# Patient Record
Sex: Male | Born: 1940 | Race: White | Hispanic: No | Marital: Married | Smoking: Never smoker
Health system: Southern US, Community
[De-identification: ages and names within clinical notes are randomized; demographics above are authoritative.]

## PROBLEM LIST (undated history)

## (undated) DIAGNOSIS — I10 Essential (primary) hypertension: Secondary | ICD-10-CM

## (undated) DIAGNOSIS — F32A Depression, unspecified: Secondary | ICD-10-CM

## (undated) DIAGNOSIS — F329 Major depressive disorder, single episode, unspecified: Secondary | ICD-10-CM

## (undated) HISTORY — DX: Major depressive disorder, single episode, unspecified: F32.9

## (undated) HISTORY — DX: Depression, unspecified: F32.A

## (undated) HISTORY — DX: Essential (primary) hypertension: I10

---

## 2005-03-20 ENCOUNTER — Ambulatory Visit: Payer: Self-pay | Admitting: Family Medicine

## 2012-03-11 ENCOUNTER — Emergency Department: Payer: Self-pay | Admitting: Emergency Medicine

## 2012-03-11 LAB — CBC
HCT: 41.4 % (ref 40.0–52.0)
MCH: 33.4 pg (ref 26.0–34.0)
MCHC: 34.1 g/dL (ref 32.0–36.0)
WBC: 6.7 10*3/uL (ref 3.8–10.6)

## 2012-03-11 LAB — COMPREHENSIVE METABOLIC PANEL
Albumin: 3.9 g/dL (ref 3.4–5.0)
BUN: 12 mg/dL (ref 7–18)
Bilirubin,Total: 0.4 mg/dL (ref 0.2–1.0)
Calcium, Total: 9.2 mg/dL (ref 8.5–10.1)
Chloride: 98 mmol/L (ref 98–107)
Creatinine: 0.86 mg/dL (ref 0.60–1.30)
Osmolality: 268 (ref 275–301)
Potassium: 4.3 mmol/L (ref 3.5–5.1)
SGPT (ALT): 26 U/L
Sodium: 132 mmol/L — ABNORMAL LOW (ref 136–145)

## 2012-11-09 ENCOUNTER — Emergency Department: Payer: Self-pay | Admitting: Emergency Medicine

## 2012-11-09 LAB — CBC WITH DIFFERENTIAL/PLATELET
Basophil #: 0.1 10*3/uL (ref 0.0–0.1)
Basophil %: 1.2 %
Eosinophil #: 0.1 10*3/uL (ref 0.0–0.7)
HCT: 41.8 % (ref 40.0–52.0)
Lymphocyte %: 12.4 %
MCHC: 33.7 g/dL (ref 32.0–36.0)
Monocyte %: 10.5 %
Neutrophil #: 8.1 10*3/uL — ABNORMAL HIGH (ref 1.4–6.5)
Neutrophil %: 75.3 %
Platelet: 346 10*3/uL (ref 150–440)
RDW: 13.3 % (ref 11.5–14.5)
WBC: 10.7 10*3/uL — ABNORMAL HIGH (ref 3.8–10.6)

## 2012-11-09 LAB — COMPREHENSIVE METABOLIC PANEL
BUN: 11 mg/dL (ref 7–18)
Bilirubin,Total: 0.8 mg/dL (ref 0.2–1.0)
Chloride: 98 mmol/L (ref 98–107)
Co2: 21 mmol/L (ref 21–32)
Creatinine: 0.76 mg/dL (ref 0.60–1.30)
EGFR (African American): >60
EGFR (Non-African Amer.): >60
Osmolality: 258 (ref 275–301)
Sodium: 127 mmol/L — ABNORMAL LOW (ref 136–145)
Total Protein: 8.2 g/dL (ref 6.4–8.2)

## 2012-11-09 LAB — URIC ACID: Uric Acid: 3.7 mg/dL (ref 3.5–7.2)

## 2012-11-16 ENCOUNTER — Other Ambulatory Visit: Payer: Self-pay | Admitting: Rheumatology

## 2012-11-22 LAB — CULTURE, BLOOD (SINGLE)

## 2013-11-24 ENCOUNTER — Ambulatory Visit: Payer: Self-pay | Admitting: Podiatry

## 2013-12-01 ENCOUNTER — Encounter: Payer: Self-pay | Admitting: Podiatry

## 2013-12-08 ENCOUNTER — Encounter: Payer: Self-pay | Admitting: Podiatry

## 2013-12-08 ENCOUNTER — Ambulatory Visit (INDEPENDENT_AMBULATORY_CARE_PROVIDER_SITE_OTHER): Payer: Medicare Other | Admitting: Podiatry

## 2013-12-08 VITALS — BP 134/67 | HR 84 | Resp 18 | Ht 66.0 in | Wt 158.0 lb

## 2013-12-08 DIAGNOSIS — M79609 Pain in unspecified limb: Secondary | ICD-10-CM

## 2013-12-08 DIAGNOSIS — B351 Tinea unguium: Secondary | ICD-10-CM

## 2013-12-08 NOTE — Progress Notes (Signed)
   Subjective:    Patient ID: Jay FarrierDan G Jeanty, male    DOB: 1941/09/04, 73 y.o.   MRN: [id number]  HPI Comments: Routine foot care      Review of Systems     Objective:   Physical Exam: Vital signs are stable he is alert and oriented x3. Nails are thick yellow dystrophic clinically mycotic. Small laceration to the lateral fourth digit and the fourth intermetatarsal space but not accommodated.        Assessment & Plan:  Assessment: Pain in limb secondary to onychomycosis.  Plan: Debridement of nails 1 through 5 bilateral covered service secondary to pain.

## 2014-01-07 ENCOUNTER — Ambulatory Visit: Payer: Self-pay | Admitting: Family Medicine

## 2014-01-07 IMAGING — US US CAROTID DUPLEX BILAT
1 series · 13 of 24 positions shown · non-contrast
Comparison: None.

CLINICAL DATA: Lightheadedness

EXAM:
BILATERAL CAROTID DUPLEX ULTRASOUND
TECHNIQUE: Gray scale imaging, color Doppler and duplex ultrasound were
performed of bilateral carotid and vertebral arteries in the neck.

[Series 1: us carotid duplex bilat · 0.08mm/px · 13 of 48 slices shown]
[im 1/48]
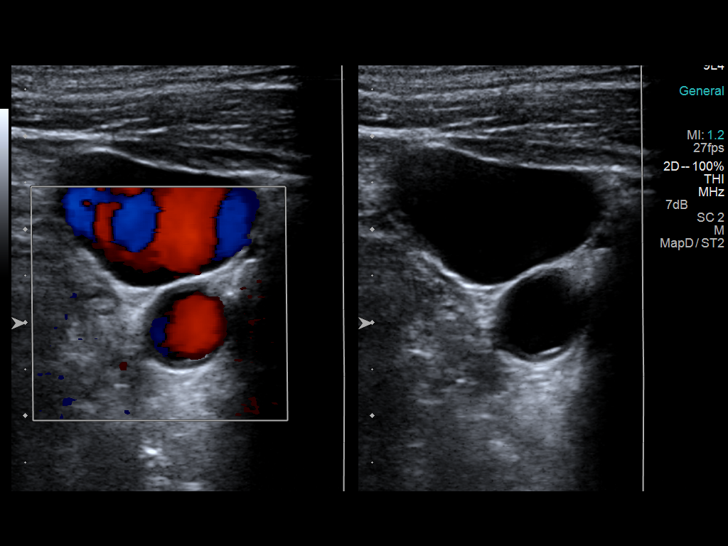
[im 5/48]
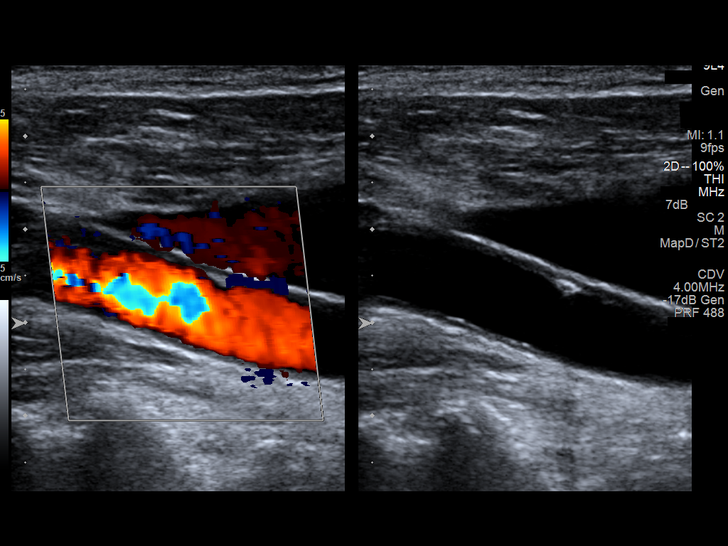
[im 9/48]
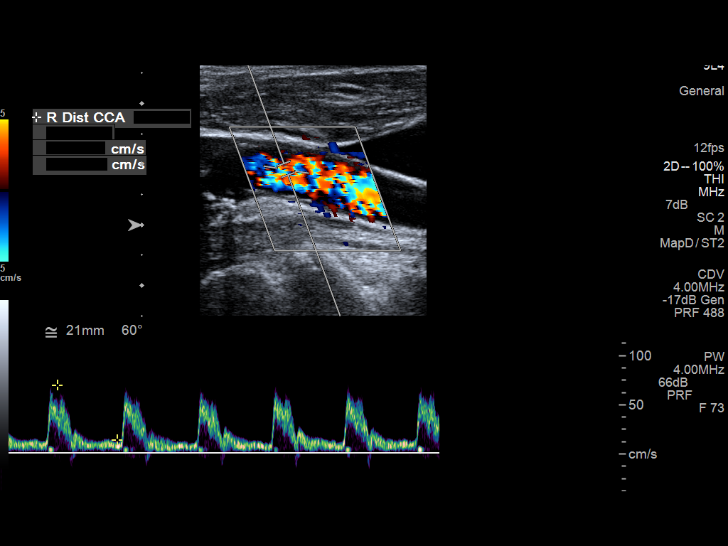
[im 13/48]
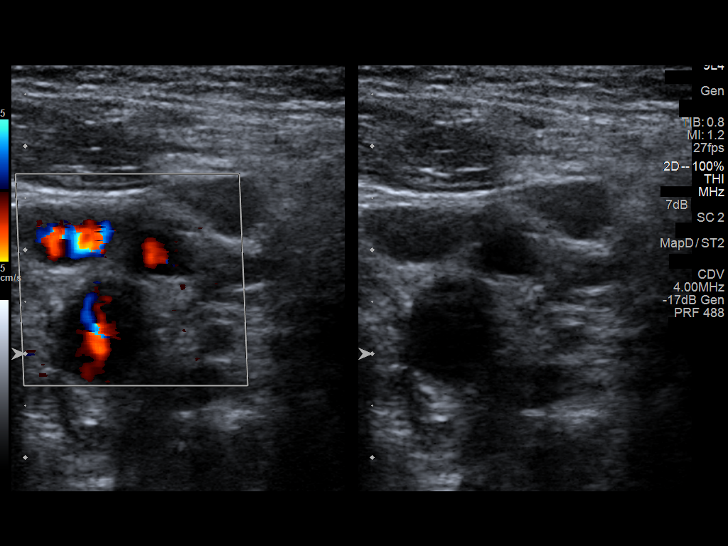
[im 17/48]
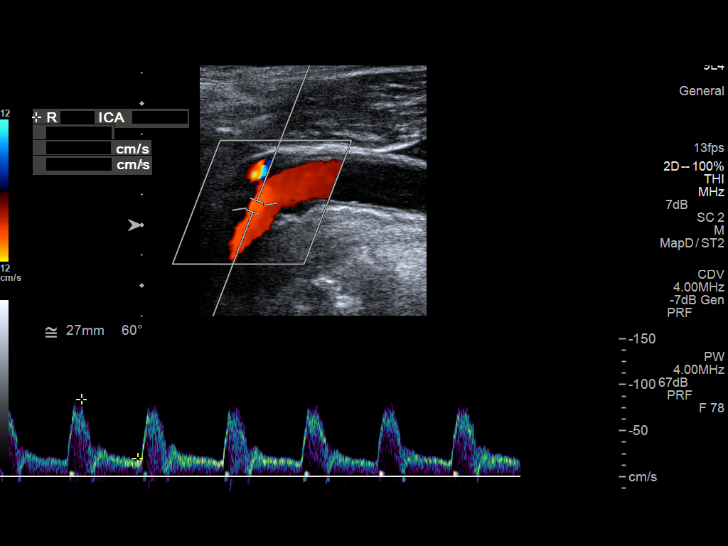
[im 21/48]
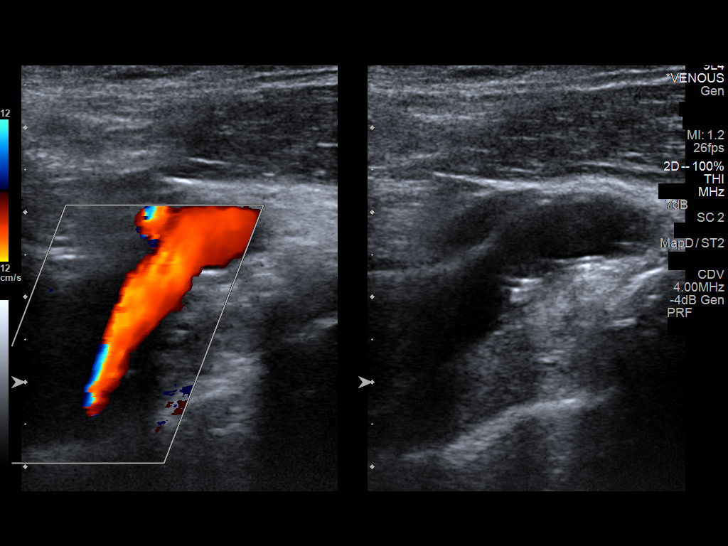
[im 25/48]
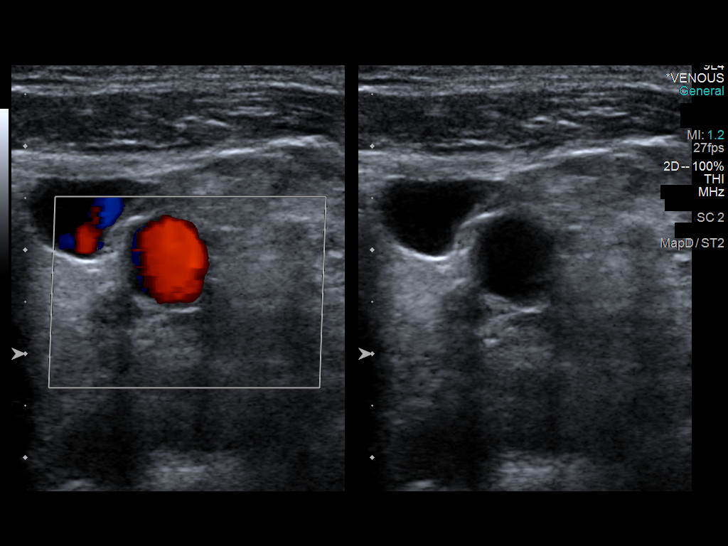
[im 27/48]
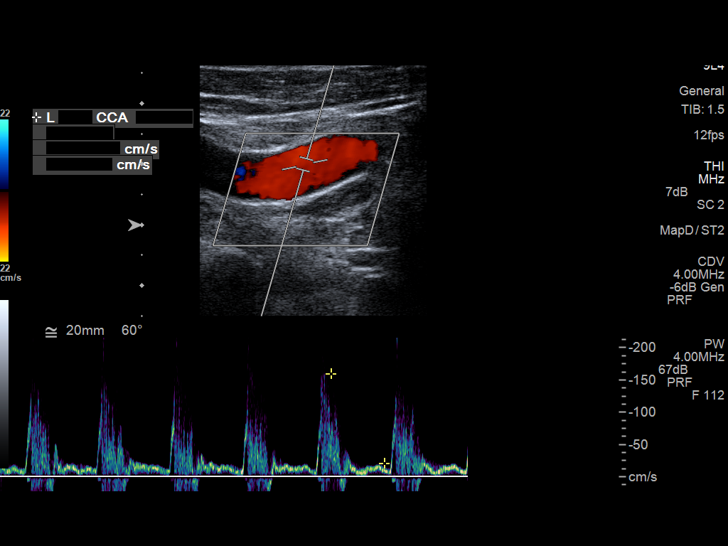
[im 31/48]
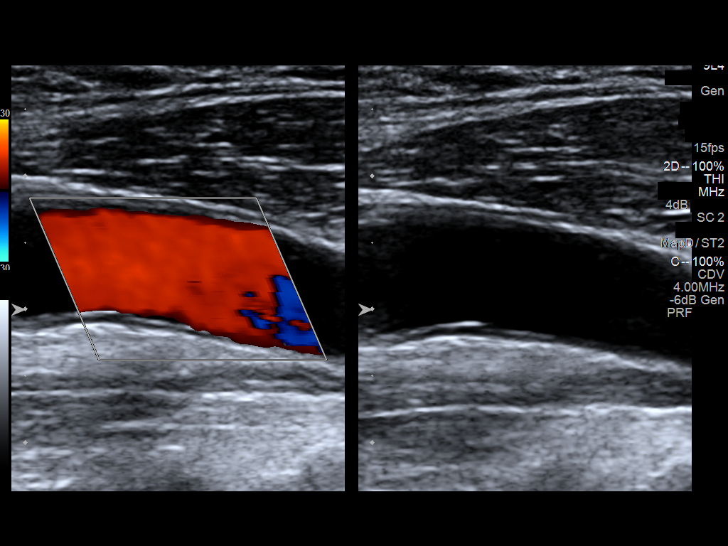
[im 35/48]
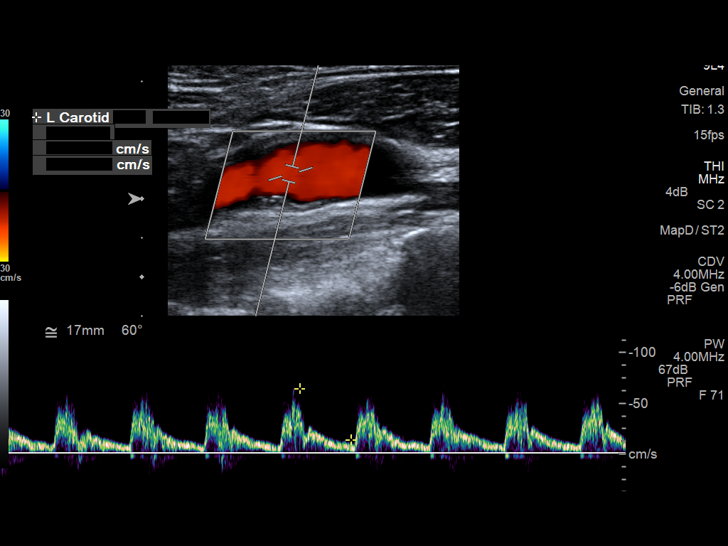
[im 39/48]
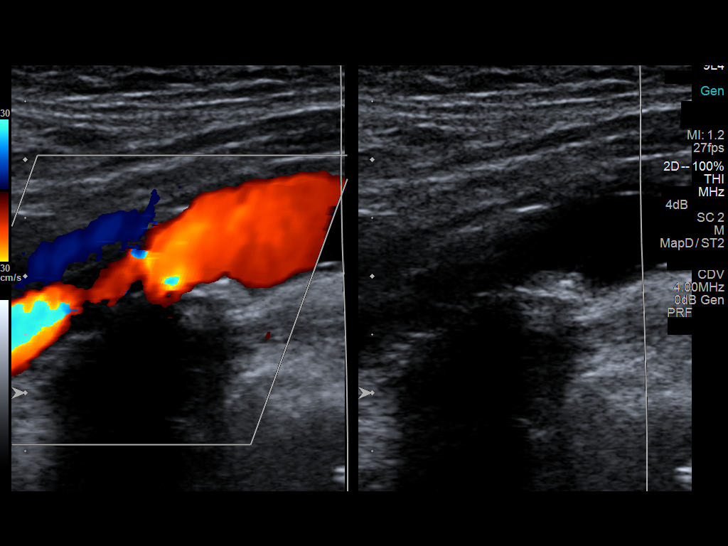
[im 43/48]
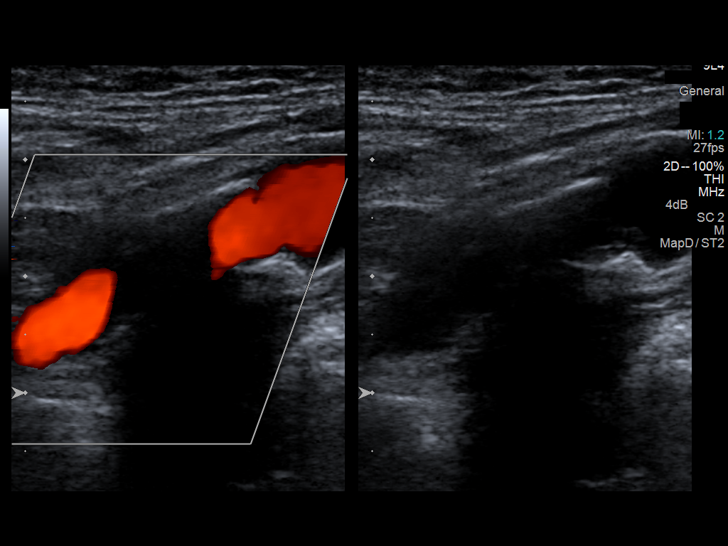
[im 48/48]
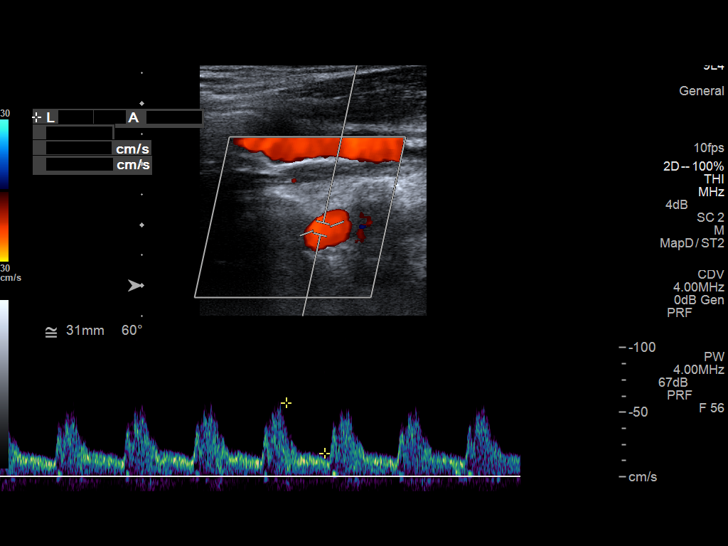

[13 of 24 positions shown; findings below may reference images not displayed]

FINDINGS: Criteria: Quantification of carotid stenosis is based on velocity
parameters that correlate the residual internal carotid diameter
with NASCET-based stenosis levels, using the diameter of the distal
internal carotid lumen as the denominator for stenosis measurement.

The following velocity measurements were obtained:

RIGHT

ICA:  89.6/32 cm/sec

CCA:  82/12 cm/sec

SYSTOLIC ICA/CCA RATIO:

DIASTOLIC ICA/CCA RATIO:

ECA:  131 cm/sec

LEFT

ICA:  101/36 cm/sec

CCA:  159/21 cm/sec

SYSTOLIC ICA/CCA RATIO:

DIASTOLIC ICA/CCA RATIO:

ECA:  124 cm/sec

RIGHT CAROTID ARTERY: Mild plaque is noted within the right carotid
system. The waveforms, velocities and flow velocity ratios however
demonstrate no evidence of focal hemodynamically significant
stenosis.

RIGHT VERTEBRAL ARTERY:  Antegrade in nature.

LEFT CAROTID ARTERY: Mild plaque is noted within the left carotid
system. The waveforms, velocities and flow velocity ratios however
demonstrate no evidence of focal hemodynamically significant
stenosis.

LEFT VERTEBRAL ARTERY:  Antegrade in nature.
IMPRESSION: Mild plaque without focal hemodynamically significant stenosis.

## 2015-01-31 DEATH — deceased
